# Patient Record
Sex: Male | Born: 2000 | Race: Black or African American | Hispanic: No | Marital: Single | Smoking: Never smoker
Health system: Southern US, Community
[De-identification: ages and names within clinical notes are randomized; demographics above are authoritative.]

## PROBLEM LIST (undated history)

## (undated) DIAGNOSIS — R011 Cardiac murmur, unspecified: Secondary | ICD-10-CM

## (undated) DIAGNOSIS — R51 Headache: Secondary | ICD-10-CM

## (undated) HISTORY — DX: Cardiac murmur, unspecified: R01.1

## (undated) HISTORY — DX: Headache: R51

---
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
## 2021-06-16 ENCOUNTER — Encounter: Payer: Self-pay | Admitting: Emergency Medicine

## 2021-06-16 ENCOUNTER — Ambulatory Visit
Admission: EM | Admit: 2021-06-16 | Discharge: 2021-06-16 | Disposition: A | Discharge status: Home / Self Care | Payer: Medicaid Other | Attending: Urgent Care | Admitting: Urgent Care

## 2021-06-16 DIAGNOSIS — R0981 Nasal congestion: Secondary | ICD-10-CM | POA: Diagnosis present

## 2021-06-16 DIAGNOSIS — R1310 Dysphagia, unspecified: Secondary | ICD-10-CM

## 2021-06-16 DIAGNOSIS — J029 Acute pharyngitis, unspecified: Secondary | ICD-10-CM

## 2021-06-16 DIAGNOSIS — R070 Pain in throat: Secondary | ICD-10-CM | POA: Diagnosis present

## 2021-06-16 LAB — POCT RAPID STREP A (OFFICE): Rapid Strep A Screen: NEGATIVE

## 2021-06-16 MED ORDER — PENICILLIN V POTASSIUM 500 MG PO TABS: 500.0000 mg | ORAL_TABLET | Freq: Three times a day (TID) | ORAL | 0 refills | Status: AC

## 2021-06-16 MED ORDER — CETIRIZINE HCL 10 MG PO TABS: 10.0000 mg | ORAL_TABLET | Freq: Every day | ORAL | 0 refills | Status: AC

## 2021-06-16 MED ORDER — PSEUDOEPHEDRINE HCL 60 MG PO TABS: 60.0000 mg | ORAL_TABLET | Freq: Three times a day (TID) | ORAL | 0 refills | Status: AC | PRN

## 2021-06-16 NOTE — ED Triage Notes (Signed)
Pt here with sore throat on just the right side x 3 days.

## 2021-06-16 NOTE — ED Provider Notes (Signed)
  [facility]   MRN: [id number] DOB: [birth year]  Subjective:   Ryan George is a 21 y.o. male presenting for 3-day history of acute onset throat pain, painful swallowing, worse over the right side.  Has also had some sinus congestion and subjective fever.  No cough, chest pain, shortness of breath or wheezing.  No current facility-administered medications for this encounter. No current outpatient medications on file.   No Known Allergies  Past Medical History:  Diagnosis Date   Headache(784.0)    Heart murmur      No past surgical history on file.  Family History  Problem Relation Age of Onset   Heart murmur Mother     Social History   Tobacco Use   Smoking status: Never  Substance Use Topics   Alcohol use: No   Drug use: No    ROS   Objective:   Vitals: There were no vitals taken for this visit.  Physical Exam Constitutional:      General: He is not in acute distress.    Appearance: Normal appearance. He is well-developed and normal weight. He is not ill-appearing, toxic-appearing or diaphoretic.  HENT:     Head: Normocephalic and atraumatic.     Right Ear: External ear normal.     Left Ear: External ear normal.     Nose: Nose normal.     Mouth/Throat:     Pharynx: Pharyngeal swelling, posterior oropharyngeal erythema (worse over right side) and uvula swelling present. No oropharyngeal exudate.     Tonsils: No tonsillar exudate or tonsillar abscesses. 0 on the right. 0 on the left.  Eyes:     General: No scleral icterus.       Right eye: No discharge.        Left eye: No discharge.     Extraocular Movements: Extraocular movements intact.  Cardiovascular:     Rate and Rhythm: Normal rate.  Pulmonary:     Effort: Pulmonary effort is normal.  Musculoskeletal:     Cervical back: Normal range of motion.  Neurological:     Mental Status: He is alert and oriented to person, place, and time.  Psychiatric:        Mood and  Affect: Mood normal.        Behavior: Behavior normal.        Thought Content: Thought content normal.        Judgment: Judgment normal.    Results for orders placed or performed during the hospital encounter of 06/16/21 (from the past 24 hour(s))  POCT rapid strep A     Status: Normal   Collection Time: 06/16/21  7:45 PM  Result Value Ref Range   Rapid Strep A Screen Negative Negative    Assessment and Plan :   PDMP not reviewed this encounter.  1. Acute pharyngitis, unspecified etiology   2. Sinus congestion   3. Throat pain   4. Painful swallowing    Strep culture pending. Will treat empirically for pharyngitis given physical exam findings.  Patient is to start penicillin, use supportive care otherwise. Counseled patient on potential for adverse effects with medications prescribed/recommended today, ER and return-to-clinic precautions discussed, patient verbalized understanding.    Wallis Bamberg, PA-C 06/16/21 2001

## 2021-06-19 LAB — CULTURE, GROUP A STREP (THRC)

## 2021-08-22 ENCOUNTER — Other Ambulatory Visit: Payer: Self-pay

## 2021-08-22 ENCOUNTER — Emergency Department (HOSPITAL_BASED_OUTPATIENT_CLINIC_OR_DEPARTMENT_OTHER)
Admission: EM | Admit: 2021-08-22 | Discharge: 2021-08-23 | Disposition: A | Discharge status: Home / Self Care | Payer: Medicaid Other | Attending: Emergency Medicine | Admitting: Emergency Medicine

## 2021-08-22 DIAGNOSIS — J039 Acute tonsillitis, unspecified: Secondary | ICD-10-CM | POA: Diagnosis not present

## 2021-08-22 DIAGNOSIS — R059 Cough, unspecified: Secondary | ICD-10-CM | POA: Diagnosis present

## 2021-08-22 DIAGNOSIS — Z20822 Contact with and (suspected) exposure to covid-19: Secondary | ICD-10-CM | POA: Diagnosis not present

## 2021-08-22 LAB — GROUP A STREP BY PCR: Group A Strep by PCR: NOT DETECTED

## 2021-08-22 LAB — SARS CORONAVIRUS 2 BY RT PCR: SARS Coronavirus 2 by RT PCR: NEGATIVE

## 2021-08-22 MED ORDER — DEXAMETHASONE 10 MG/ML FOR PEDIATRIC ORAL USE
10.0000 mg | Freq: Once | INTRAMUSCULAR | Status: AC
  Administered 2021-08-23: 10 mg via ORAL
  Filled 2021-08-22: qty 1

## 2021-08-22 MED ORDER — AMOXICILLIN 250 MG/5ML PO SUSR
500.0000 mg | Freq: Once | ORAL | Status: AC
  Administered 2021-08-23: 500 mg via ORAL
  Filled 2021-08-22: qty 10

## 2021-08-22 MED ORDER — IBUPROFEN 100 MG/5ML PO SUSP
600.0000 mg | Freq: Once | ORAL | Status: AC
  Administered 2021-08-23: 600 mg via ORAL
  Filled 2021-08-22: qty 30

## 2021-08-22 MED ORDER — IBUPROFEN 800 MG PO TABS: 800.0000 mg | ORAL_TABLET | Freq: Once | ORAL | Status: DC

## 2021-08-22 NOTE — ED Triage Notes (Signed)
POV, pt c/o abd pain that began a couple days ago, now sts that sore throat, headache, body aches, and cough. Pt alert and oriented x 4, amb to triage. Denies major hx.

## 2021-08-22 NOTE — ED Provider Notes (Signed)
[facility] EMERGENCY DEPT Provider Note   CSN: [id number] Arrival date & time: 08/22/21  1856     History  Chief Complaint  Patient presents with   Cough   Sore Throat    Ryan George is a 21 y.o. male.  Patient is a  21 yo male presenting for fever, chills, headache, sore throat, cough, and body aches 2 days. Denies nausea, vomiting, or diarrhea.   The history is provided by the patient. No language interpreter was used.  Cough Associated symptoms: fever, myalgias and sore throat   Associated symptoms: no chest pain, no chills, no ear pain, no rash and no shortness of breath   Sore Throat Pertinent negatives include no chest pain, no abdominal pain and no shortness of breath.       Home Medications Prior to Admission medications   Medication Sig Start Date End Date Taking? Authorizing Provider  amoxicillin (AMOXIL) 250 MG/5ML suspension Take 15 mLs (750 mg total) by mouth daily for 7 days. 08/23/21 08/30/21 Yes Edwin Dada P, DO  ibuprofen (ADVIL) 100 MG/5ML suspension Take 30 mLs (600 mg total) by mouth every 8 (eight) hours as needed for mild pain, fever or moderate pain. 08/23/21  Yes Edwin Dada P, DO  cetirizine (ZYRTEC ALLERGY) 10 MG tablet Take 1 tablet (10 mg total) by mouth daily. 06/16/21   Wallis Bamberg, PA-C  penicillin v potassium (VEETID) 500 MG tablet Take 1 tablet (500 mg total) by mouth 3 (three) times daily. 06/16/21   Wallis Bamberg, PA-C  pseudoephedrine (SUDAFED) 60 MG tablet Take 1 tablet (60 mg total) by mouth every 8 (eight) hours as needed for congestion. 06/16/21   Wallis Bamberg, PA-C      Allergies    Patient has no known allergies.    Review of Systems   Review of Systems  Constitutional:  Positive for fever. Negative for chills.  HENT:  Positive for sore throat. Negative for ear pain.   Eyes:  Negative for pain and visual disturbance.  Respiratory:  Positive for cough. Negative for shortness of breath.   Cardiovascular:  Negative  for chest pain and palpitations.  Gastrointestinal:  Negative for abdominal pain and vomiting.  Genitourinary:  Negative for dysuria and hematuria.  Musculoskeletal:  Positive for myalgias. Negative for arthralgias and back pain.  Skin:  Negative for color change and rash.  Neurological:  Negative for seizures and syncope.  All other systems reviewed and are negative.   Physical Exam Updated Vital Signs BP 117/73   Pulse 87   Temp 99.5 F (37.5 C) (Oral)   Resp 16   Ht 6\' 1"  (1.854 m)   SpO2 99%  Physical Exam Vitals and nursing note reviewed.  Constitutional:      General: He is not in acute distress.    Appearance: He is well-developed.  HENT:     Head: Normocephalic and atraumatic.     Mouth/Throat:     Pharynx: Oropharynx is clear. Uvula midline.     Tonsils: Tonsillar exudate present. No tonsillar abscesses. 4+ on the right. 4+ on the left.  Eyes:     Conjunctiva/sclera: Conjunctivae normal.  Cardiovascular:     Rate and Rhythm: Normal rate and regular rhythm.     Heart sounds: No murmur heard. Pulmonary:     Effort: Pulmonary effort is normal. No respiratory distress.     Breath sounds: Normal breath sounds.  Abdominal:     Palpations: Abdomen is soft.     Tenderness:  There is no abdominal tenderness.  Musculoskeletal:        General: No swelling.     Cervical back: Neck supple.  Skin:    General: Skin is warm and dry.     Capillary Refill: Capillary refill takes less than 2 seconds.  Neurological:     Mental Status: He is alert.  Psychiatric:        Mood and Affect: Mood normal.     ED Results / Procedures / Treatments   Labs (all labs ordered are listed, but only abnormal results are displayed) Labs Reviewed  GROUP A STREP BY PCR  SARS CORONAVIRUS 2 BY RT PCR    EKG None  Radiology No results found.  Procedures Procedures    Medications Ordered in ED Medications  ibuprofen (ADVIL) 100 MG/5ML suspension 600 mg (has no administration in  time range)  dexamethasone (DECADRON) 10 MG/ML injection for Pediatric ORAL use 10 mg (has no administration in time range)  amoxicillin (AMOXIL) 250 MG/5ML suspension 500 mg (has no administration in time range)    ED Course/ Medical Decision Making/ A&P                           Medical Decision Making Risk Prescription drug management.   12:03 AM   21 yo male presenting for fever, chills, headache, sore throat, cough, and body aches 2 days.  Is alert oriented x3, no acute distress, afebrile, stable vital signs.  Physical exam demonstrates erythematous tonsils +4 with tonsillar exudates.  No tonsillar abscesses.  Uvula is midline.  No difficulties with swallowing or tolerating secretions.  Patient is other well-appearing without any signs or symptoms of sepsis.  Concerns for tonsillitis.  Strep and covid negative. Liquid Motrin, Decadron, and antibiotic given in ED and sent to pharmacy.  Given follow-up recommendations with ENT due to her episodes of tonsillitis.  Patient in no distress and overall condition improved here in the ED. Detailed discussions were had with the patient regarding current findings, and need for close f/u with PCP or on call doctor. The patient has been instructed to return immediately if the symptoms worsen in any way for re-evaluation. Patient verbalized understanding and is in agreement with current care plan. All questions answered prior to discharge.         Final Clinical Impression(s) / ED Diagnoses Final diagnoses:  Tonsillitis    Rx / DC Orders ED Discharge Orders          Ordered    amoxicillin (AMOXIL) 250 MG/5ML suspension  Daily        08/23/21 0002    ibuprofen (ADVIL) 100 MG/5ML suspension  Every 8 hours PRN        08/23/21 0002              Edwin Dada P, DO 08/23/21 0003

## 2021-08-23 MED ORDER — IBUPROFEN 100 MG/5ML PO SUSP
600.0000 mg | Freq: Three times a day (TID) | ORAL | 0 refills | Status: AC | PRN
Start: 2021-08-23 — End: ?

## 2021-08-23 MED ORDER — AMOXICILLIN 250 MG/5ML PO SUSR: 750.0000 mg | Freq: Every day | ORAL | 0 refills | Status: AC
